# Patient Record
Sex: Male | Born: 1950 | Race: Black or African American | Hispanic: No | Marital: Single | Smoking: Current every day smoker
Health system: Southern US, Community
[De-identification: ages and names within clinical notes are randomized; demographics above are authoritative.]

## PROBLEM LIST (undated history)

## (undated) HISTORY — PX: TONSILLECTOMY: SUR1361

---

## 2015-10-28 ENCOUNTER — Emergency Department (HOSPITAL_COMMUNITY): Payer: Worker's Compensation

## 2015-10-28 ENCOUNTER — Emergency Department (HOSPITAL_COMMUNITY)
Admission: EM | Admit: 2015-10-28 | Discharge: 2015-10-28 | Disposition: A | Discharge status: Home / Self Care | Payer: Worker's Compensation | Attending: Emergency Medicine | Admitting: Emergency Medicine

## 2015-10-28 ENCOUNTER — Encounter (HOSPITAL_COMMUNITY): Payer: Self-pay | Admitting: Emergency Medicine

## 2015-10-28 DIAGNOSIS — S61441A Puncture wound with foreign body of right hand, initial encounter: Secondary | ICD-10-CM | POA: Insufficient documentation

## 2015-10-28 DIAGNOSIS — F17210 Nicotine dependence, cigarettes, uncomplicated: Secondary | ICD-10-CM | POA: Diagnosis not present

## 2015-10-28 DIAGNOSIS — Y9269 Other specified industrial and construction area as the place of occurrence of the external cause: Secondary | ICD-10-CM | POA: Insufficient documentation

## 2015-10-28 DIAGNOSIS — Y990 Civilian activity done for income or pay: Secondary | ICD-10-CM | POA: Insufficient documentation

## 2015-10-28 DIAGNOSIS — W458XXA Other foreign body or object entering through skin, initial encounter: Secondary | ICD-10-CM | POA: Diagnosis not present

## 2015-10-28 DIAGNOSIS — Y9389 Activity, other specified: Secondary | ICD-10-CM | POA: Insufficient documentation

## 2015-10-28 DIAGNOSIS — S6990XA Unspecified injury of unspecified wrist, hand and finger(s), initial encounter: Secondary | ICD-10-CM | POA: Diagnosis present

## 2015-10-28 DIAGNOSIS — Z23 Encounter for immunization: Secondary | ICD-10-CM | POA: Diagnosis not present

## 2015-10-28 DIAGNOSIS — S60551A Superficial foreign body of right hand, initial encounter: Secondary | ICD-10-CM

## 2015-10-28 MED ORDER — TETANUS-DIPHTH-ACELL PERTUSSIS 5-2.5-18.5 LF-MCG/0.5 IM SUSP: 0.5000 mL | Freq: Once | INTRAMUSCULAR | Status: DC

## 2015-10-28 MED ORDER — LIDOCAINE HCL (PF) 2 % IJ SOLN
10.0000 mL | Freq: Once | INTRAMUSCULAR | Status: DC
  Filled 2015-10-28: qty 10

## 2015-10-28 MED ORDER — DOXYCYCLINE HYCLATE 100 MG PO TABS
100.0000 mg | ORAL_TABLET | Freq: Once | ORAL | Status: AC
  Administered 2015-10-28: 100 mg via ORAL
  Filled 2015-10-28: qty 1

## 2015-10-28 MED ORDER — TETANUS-DIPHTH-ACELL PERTUSSIS 5-2.5-18.5 LF-MCG/0.5 IM SUSP
0.5000 mL | Freq: Once | INTRAMUSCULAR | Status: AC
  Administered 2015-10-28: 0.5 mL via INTRAMUSCULAR
  Filled 2015-10-28: qty 0.5

## 2015-10-28 MED ORDER — HYDROCODONE-ACETAMINOPHEN 5-325 MG PO TABS: 1.0000 | ORAL_TABLET | ORAL | Status: AC | PRN

## 2015-10-28 MED ORDER — DOXYCYCLINE HYCLATE 100 MG PO CAPS
100.0000 mg | ORAL_CAPSULE | Freq: Two times a day (BID) | ORAL | Status: AC
Start: 2015-10-28 — End: ?

## 2015-10-28 NOTE — ED Notes (Signed)
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 C/m/s intact.

## 2015-10-28 NOTE — ED Notes (Signed)
Pt verbalized understanding of no driving and to use caution within 4 hours of taking pain meds due to meds cause drowsiness.  Instructed pt to take all of antibiotics as prescribed. 

## 2015-10-28 NOTE — ED Provider Notes (Signed)
CSN: [id number]     Arrival date & time 10/28/15  1207 History  By signing my name below, I, Lyndel Safe, attest that this documentation has been prepared under the direction and in the presence of Ivery Quale, PA-C. Electronically Signed: Lyndel Safe, ED Scribe. 10/28/2015. 2:04 PM.   Chief Complaint  Patient presents with  . Hand Injury   Patient is a 65 y.o. male presenting with hand injury. The history is provided by the patient. No language interpreter was used.  Hand Injury Location:  Hand Time since incident:  3 hours Injury: yes   Mechanism of injury comment:  Puncture wound, splinter Hand location:  R hand Pain details:    Radiates to:  Does not radiate   Severity:  Moderate   Onset quality:  Sudden   Duration:  3 hours   Timing:  Constant   Progression:  Unchanged Chronicity:  New Dislocation: no   Foreign body present:  Wood Tetanus status:  Out of date Relieved by:  None tried Worsened by:  Movement Ineffective treatments:  None tried Associated symptoms: no numbness and no tingling    HPI Comments: Matthew Stein is a 65 y.o. male, with no pertinent past medical history, who presents to the Emergency Department for evaluation of right hand injury that occurred 3 hours ago while at work. Pt reports he was moving a pile of wood when a piece of the wood splintered off and entered his right hand on the volar aspect at the base of the thenar eminence. He associates sudden osnet, constant, moderate pain to site of puncture wound that is exacerbated with opening and closing of right hand and ROM of right wrist. There is no active bleeding from the area. No loss of sensation in right hand. He has no other complaints at this time. Tetanus not UTD.   History reviewed. No pertinent past medical history. Past Surgical History  Procedure Laterality Date  . Tonsillectomy     No family history on file. Social History  Substance Use Topics  . Smoking status: Current  Every Day Smoker -- 0.50 packs/day    Types: Cigarettes  . Smokeless tobacco: None  . Alcohol Use: 1.2 oz/week    2 Cans of beer per week    Review of Systems  Skin: Positive for wound ( splinter in right hand).  Neurological: Negative for weakness and numbness.  All other systems reviewed and are negative.  Allergies  Shrimp  Home Medications   Prior to Admission medications   Not on File   BP 132/98 mmHg  Pulse 89  Temp(Src) 97.9 F (36.6 C) (Oral)  Resp 18  Ht  (1.753 m)  Wt 145 lb (65.772 kg)  BMI 21.40 kg/m2  SpO2 100% Physical Exam  Constitutional: He is oriented to person, place, and time. He appears well-developed and well-nourished. No distress.  HENT:  Head: Normocephalic.  Eyes: Conjunctivae are normal.  Neck: Normal range of motion. Neck supple.  Cardiovascular: Normal rate.   Pulmonary/Chest: Effort normal. No respiratory distress.  Musculoskeletal: Normal range of motion. He exhibits tenderness.  Right hand; good ROM of fingers, puncture wound at the base of the thenar eminence, there is tenderness around puncture site, pain with ROM of right thumb; radial pulse 2+; no temperature changes of right hand; cap re-fill less than 2 seconds.   Neurological: He is alert and oriented to person, place, and time. Coordination normal.  Skin: Skin is warm.  Psychiatric: He has a normal  mood and affect. His behavior is normal.  Nursing note and vitals reviewed.   ED Course  Procedures  DIAGNOSTIC STUDIES: Oxygen Saturation is 100% on RA, normal by my interpretation.    COORDINATION OF CARE: 1:57 PM Discussed treatment plan with pt at bedside which includes to anesthetize the area and explore for FB. Pt agreed to plan.   REMOVAL OF FOREIGN BODY - RIGHT HAND 2:32 PM Tetanus booster ordered. Right thenar eminence locally anesthetized with lidocaine 2% w/o epinephrine and area explored. Unable to locate foreign body. Wound irrigated without removal of fb. Wound  closed with two interrupted sutures of 4-0 Prolene. Dressing applied. Will order an [place] of right hand.  3:15 PM [place] of right hand resulted in radiopaque FB resembling wood splinter in the subcutaneous tissue at the volar aspect of the right right thenar eminence. Will refer to hand surgery and prescribe an antibiotic course. Pt agreeable to plan.    [place] Extrem Up Right Ltd  10/28/2015  CLINICAL DATA:  Splinter into RIGHT thumb from hardwood flooring EXAM: ULTRASOUND RIGHT UPPER EXTREMITY LIMITED TECHNIQUE: Ultrasound examination of the upper extremity soft tissues was performed in the area of clinical concern at the volar aspect of the RIGHT thenar eminence. COMPARISON:  Radiographs 10/28/2015 FINDINGS: At the RIGHT thenar eminence, a linear radiopaque foreign body is identified within the subcutaneous tissues. In transverse imaging this causes discrete posterior acoustic shadowing. Foreign body measures approximately 21 mm length. Finding is compatible with a linear foreign body. The foreign body courses from superficial proximally to deeper distally. This foreign body is located distal to the site of the soft tissue wound. IMPRESSION: 21 mm length shadowing radiopaque foreign body at the volar aspect of the RIGHT thenar eminence compatible with a wood splinter fragment. Electronically Signed   By: Ulyses Southward M.D.   On: 10/28/2015 15:10   Dg Hand Complete Right  10/28/2015  CLINICAL DATA:  Splinter in RIGHT hand while moving a pile of wood at work today, medial RIGHT thumb pain, injury EXAM: RIGHT HAND - COMPLETE 3+ VIEW COMPARISON:  None FINDINGS: Osseous mineralization normal. Minimal cystic changes at scaphoid and lunate. Questionable small juxta-articular erosions at the heads of the proximal phalanges of the middle and ring fingers. No acute fracture, dislocation or bone destruction. No radiopaque foreign bodies or definite soft tissue gas at the described site of symptoms at the RIGHT thumb. Tiny  metallic foreign body at tip of distal phalanx middle finger at the skin surface. IMPRESSION: No acute osseous abnormalities. Tiny metallic foreign body at tip of middle finger. Juxta-articular erosions at the PIP joints of the RIGHT ring and middle fingers with questionable additional erosions versus cystic changes at the carpus, cannot exclude an inflammatory arthropathy. Electronically Signed   By: Ulyses Southward M.D.   On: 10/28/2015 13:41    I have personally reviewed and evaluated these images results as part of my medical decision-making.   MDM  Attempt to remove the splinter at the puncture site was unsuccessful. Ultrasound reveals that the splinter was further into the thenar eminence. The patient is referred to Dr. Janee Morn for hand surgery evaluation and management. Patient placed on doxycycline, as well as Norco for pain. Questions answered, patient appropriate for discharge this time.    Final diagnoses:  Foreign body in hand, right, initial encounter    **I have reviewed nursing notes, vital signs, and all appropriate lab and imaging results for this patient.*  **I personally performed the services described  in this documentation, which was scribed in my presence. The recorded information has been reviewed and is accurate.   Ivery QualeHobson Jerol Rufener, PA-C 10/28/15 2052  Eber HongBrian Miller, MD 10/29/15 [phone]

## 2015-10-28 NOTE — Discharge Instructions (Signed)
The ultrasound of your hand reveals a foreign body in the right hand, in the area behind your thumb. Please use doxycycline 2 times daily with food. Use Tylenol or ibuprofen for mild pain, use Norco for more severe pain. Norco may cause drowsiness, please do not drive or operate machinery, or drink alcohol, does state in activities requiring concentration when taking this medication.

## 2016-12-25 IMAGING — US US EXTREM UP *R* LTD
2 series · 14 of 22 positions shown · non-contrast
Comparison: Radiographs 10/28/2015

CLINICAL DATA: Splinter into RIGHT thumb from hardwood flooring

EXAM:
ULTRASOUND RIGHT UPPER EXTREMITY LIMITED
TECHNIQUE: Ultrasound examination of the upper extremity soft tissues was
performed in the area of clinical concern at the volar aspect of the
RIGHT thenar eminence.

[Series 1: us extrem up *right* ltd · 0.06mm/px · 11 of 17 slices shown (1 of 2)]
[im 1/17]
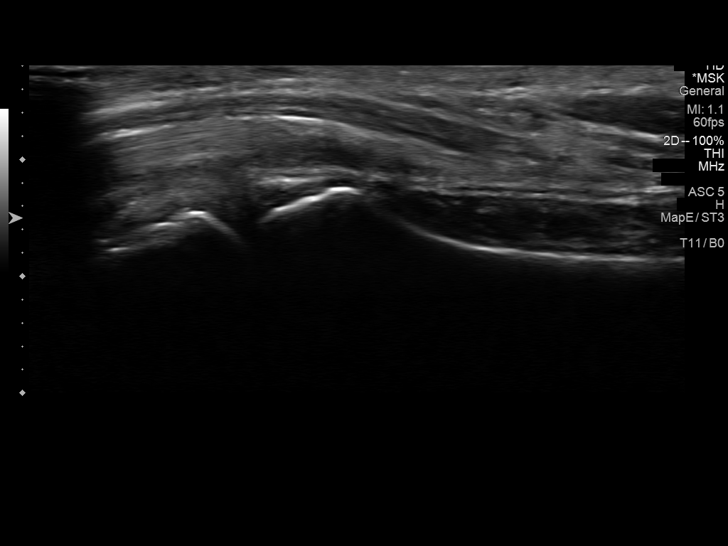
[im 3/17]
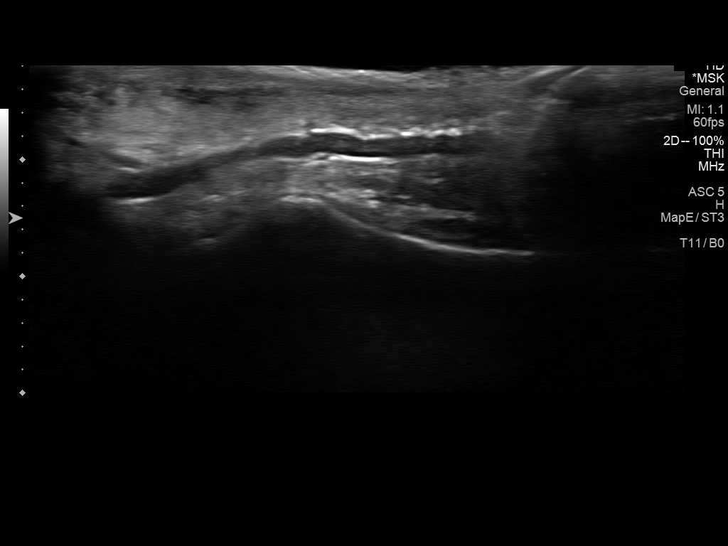
[im 4/17]
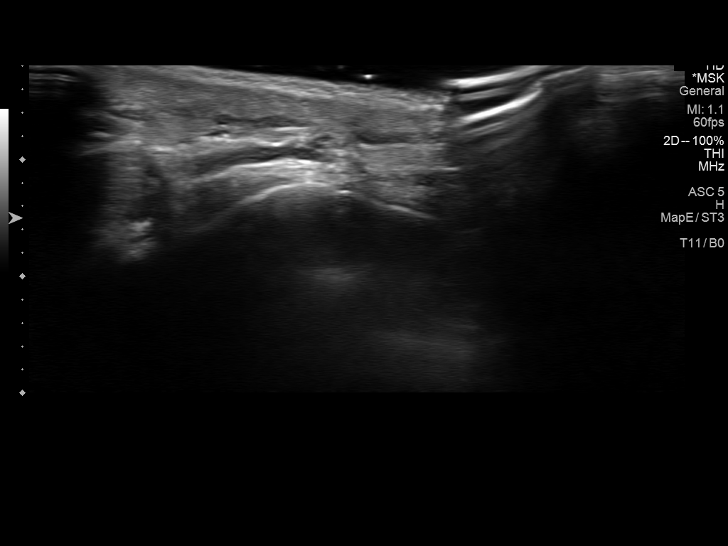
[im 6/17]
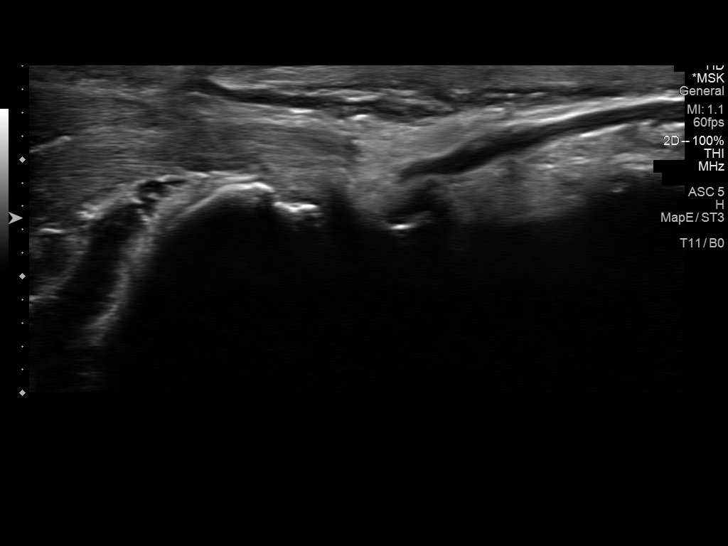
[im 8/17]
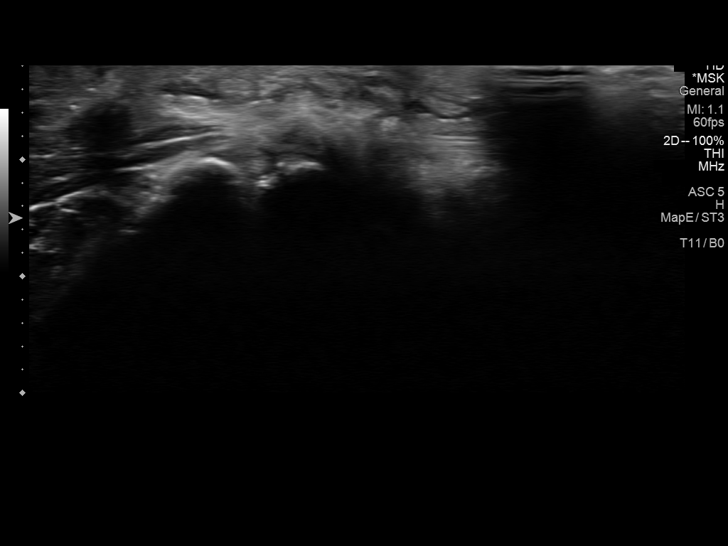
[im 9/17]
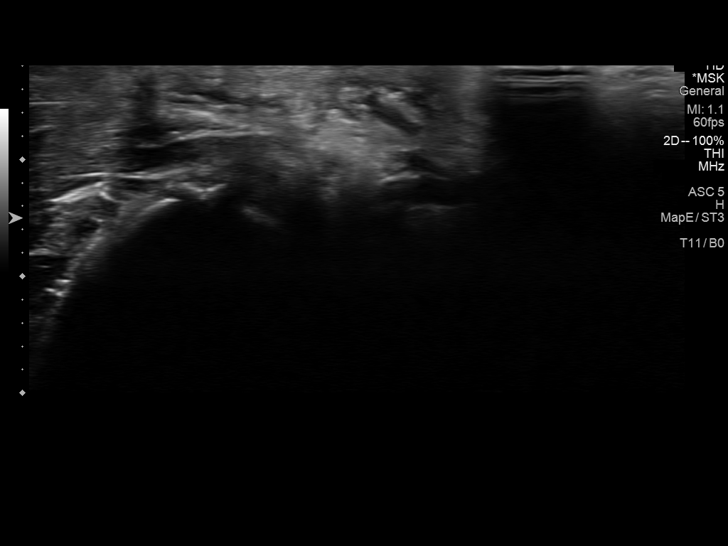
[im 11/17]
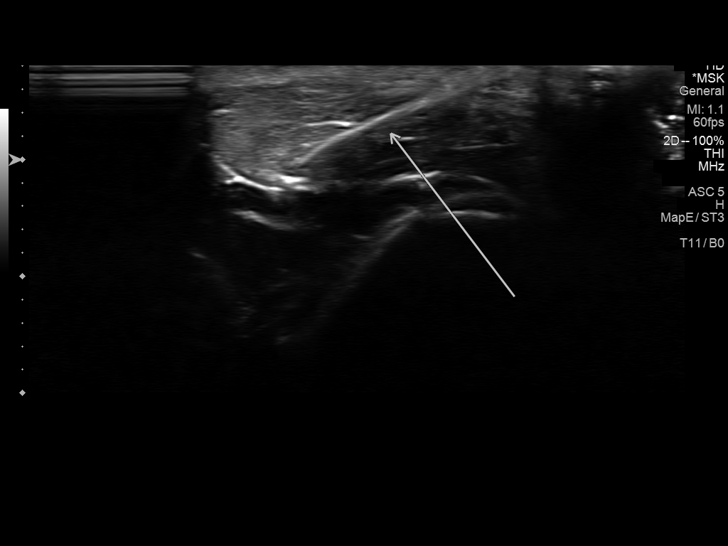
[im 12/17]
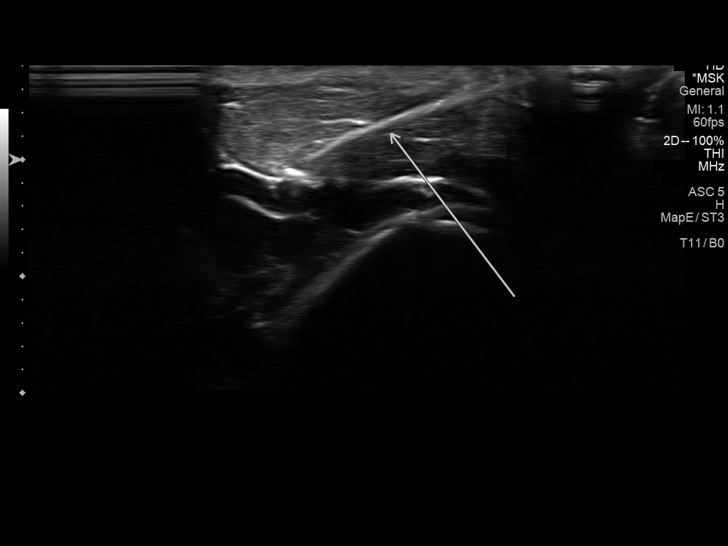
[im 14/17]
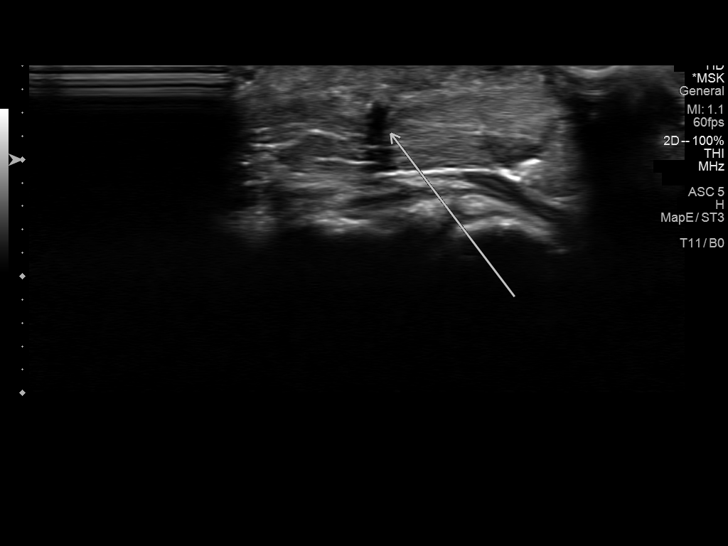
[im 15/17]
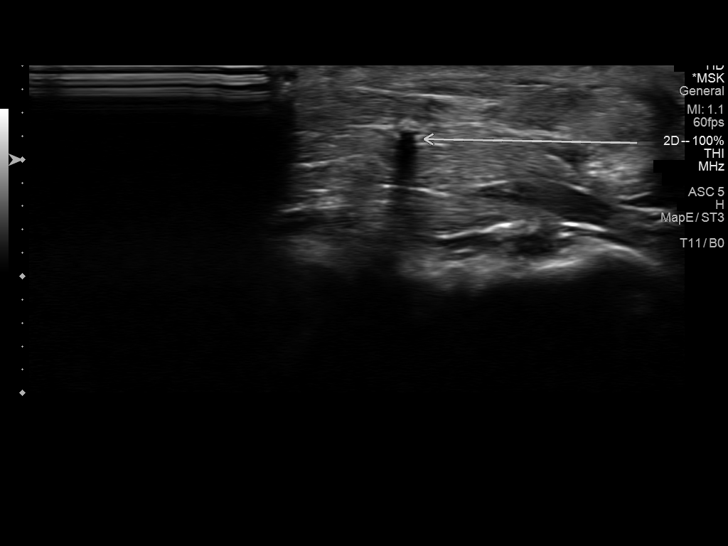
[im 17/17]
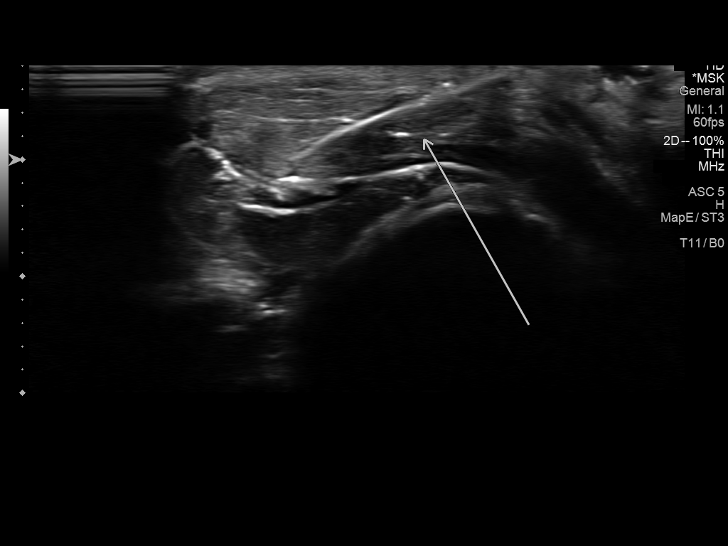

[Series 2: us extrem up *right* ltd · 5 acquisitions, 3 frames shown (2 of 2)]
[im 2/5]
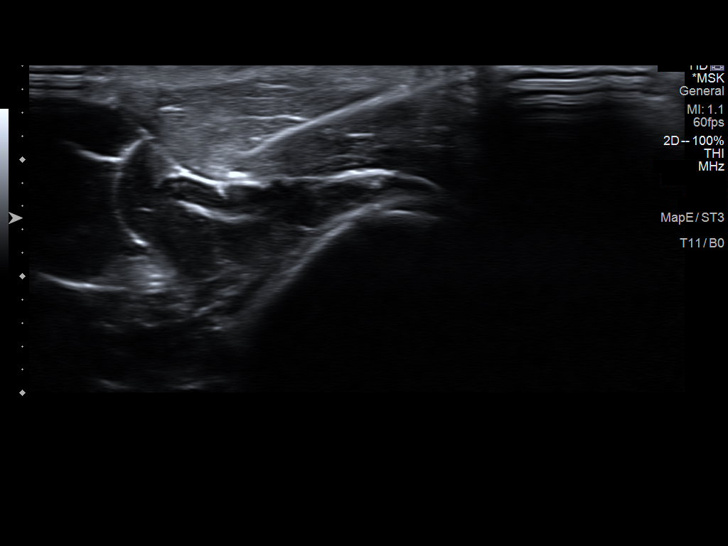
[im 3/5]
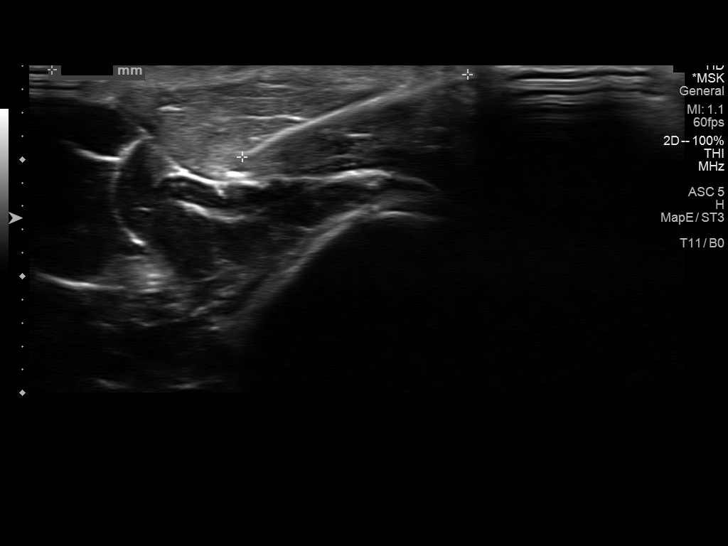
[im 5/5]
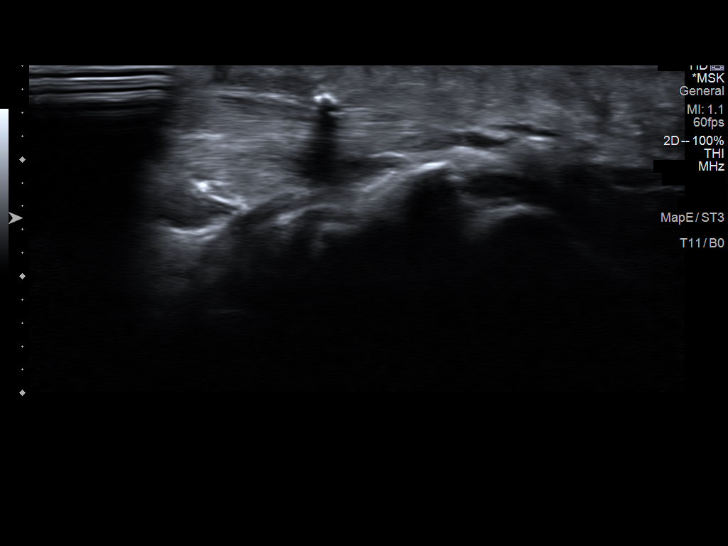

[14 of 22 positions shown; findings below may reference images not displayed]

FINDINGS: At the RIGHT thenar eminence, a linear radiopaque foreign body is
identified within the subcutaneous tissues.

In transverse imaging this causes discrete posterior acoustic
shadowing.

Foreign body measures approximately 21 mm length.

Finding is compatible with a linear foreign body.

The foreign body courses from superficial proximally to deeper
distally.

This foreign body is located distal to the site of the soft tissue
wound.
IMPRESSION: 21 mm length shadowing radiopaque foreign body at the volar aspect
of the RIGHT thenar eminence compatible with Sumer Watanabe
fragment.

## 2016-12-25 IMAGING — DX DG HAND COMPLETE 3+V*R*
3 series · 3 of 3 positions shown · non-contrast
Comparison: None

CLINICAL DATA: Splinter in RIGHT hand while moving a pile of [employer]
at work today, medial RIGHT thumb pain, injury

EXAM:
RIGHT HAND - COMPLETE 3+ VIEW

[hand pa]
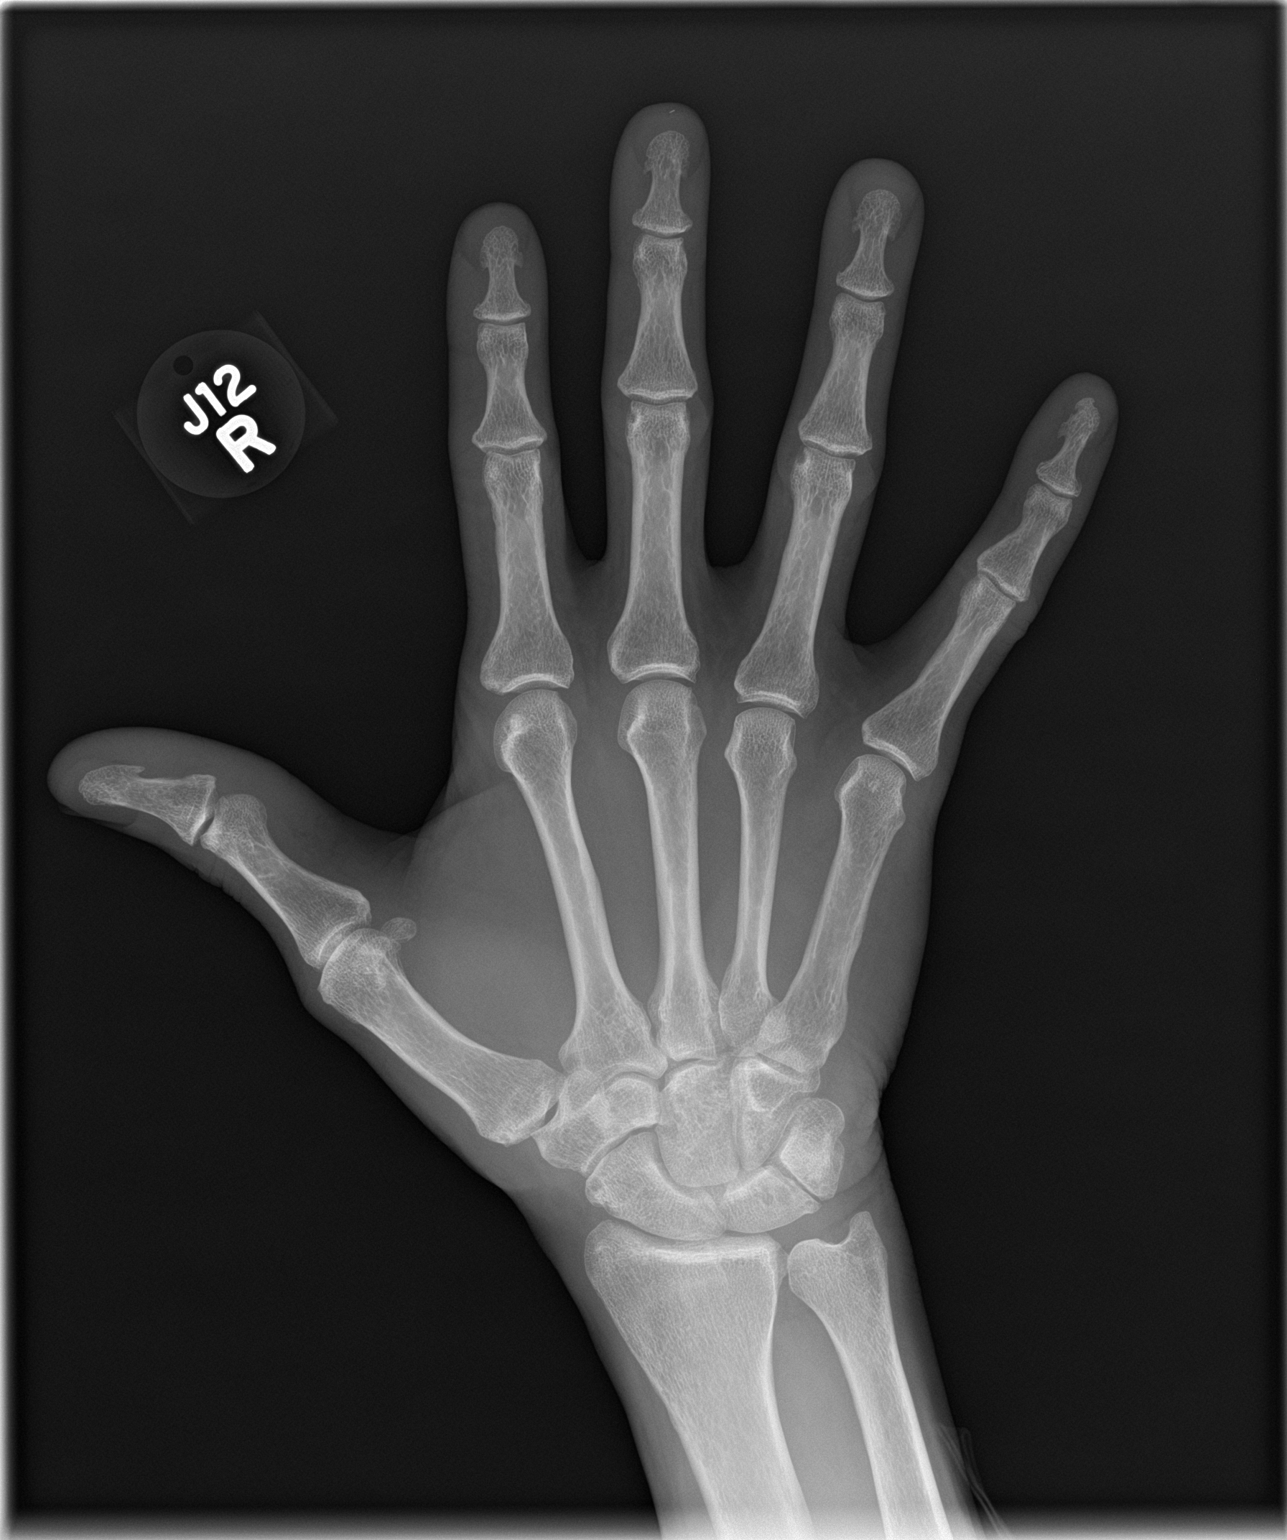

[hand obl]
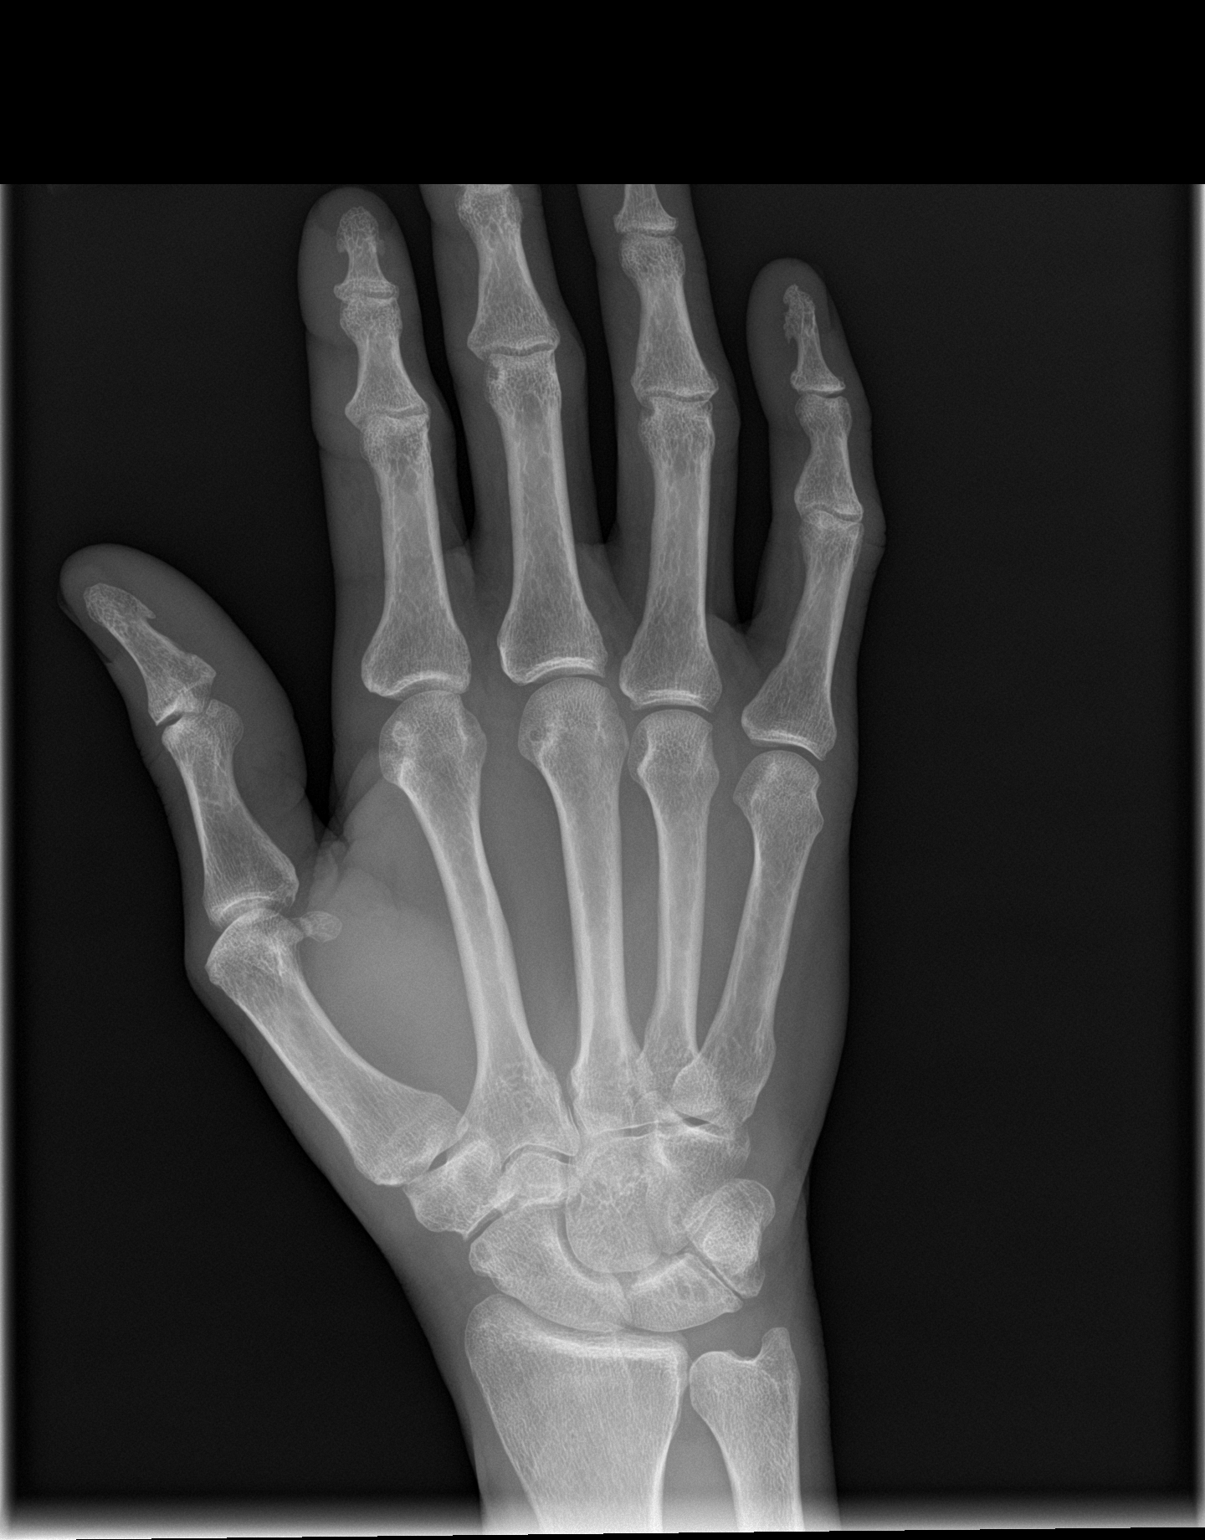

[hand lat]
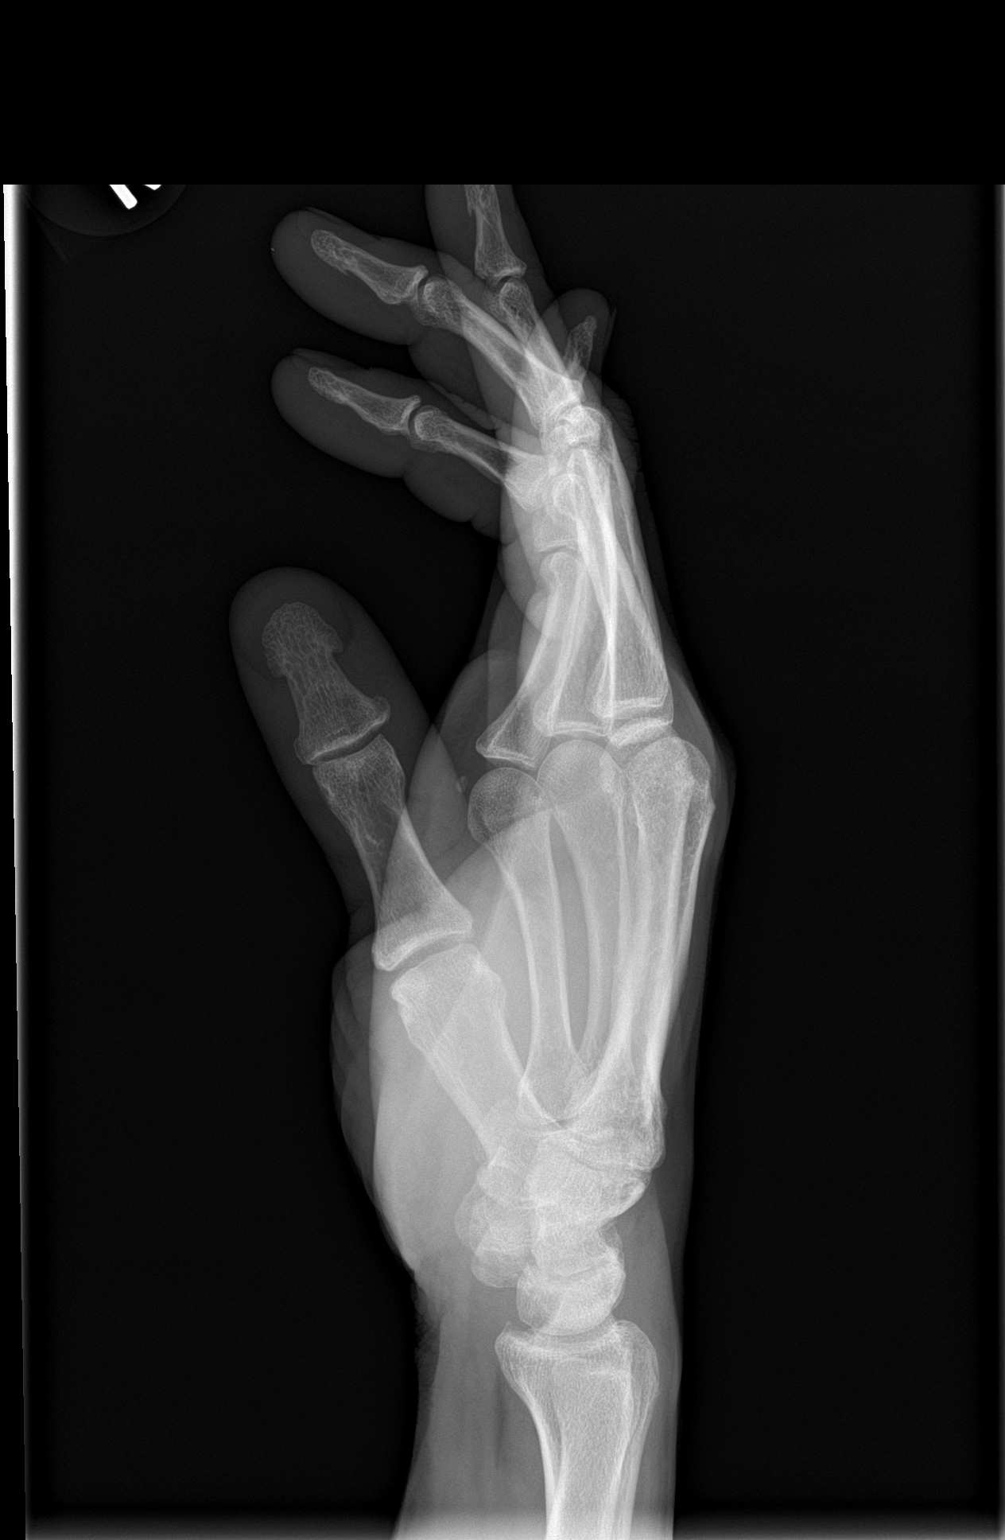

[3 of 3 positions shown; findings below may reference images not displayed]

FINDINGS: Osseous mineralization normal.

Minimal cystic changes at scaphoid and lunate.

Questionable small juxta-articular erosions at the heads of the
proximal phalanges of the middle and ring fingers.

No acute fracture, dislocation or bone destruction.

No radiopaque foreign bodies or definite soft tissue gas at the
described site of symptoms at the RIGHT thumb.

Tiny metallic foreign body at tip of distal phalanx middle finger at
the skin surface.
IMPRESSION: No acute osseous abnormalities.

Tiny metallic foreign body at tip of middle finger.

Juxta-articular erosions at the PIP joints of the RIGHT ring and
middle fingers with questionable additional erosions versus cystic
changes at the carpus, cannot exclude an inflammatory arthropathy.
